# Patient Record
Sex: Male | Born: 2000 | Race: Black or African American | Hispanic: No | Marital: Single | Smoking: Never smoker
Health system: Southern US, Community
[De-identification: ages and names within clinical notes are randomized; demographics above are authoritative.]

## PROBLEM LIST (undated history)

## (undated) HISTORY — PX: HERNIA REPAIR: SHX51

---
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
## 2018-02-14 ENCOUNTER — Encounter (HOSPITAL_COMMUNITY): Payer: Self-pay

## 2018-02-14 ENCOUNTER — Emergency Department (HOSPITAL_COMMUNITY): Payer: Medicaid Other

## 2018-02-14 ENCOUNTER — Emergency Department (HOSPITAL_COMMUNITY)
Admission: EM | Admit: 2018-02-14 | Discharge: 2018-02-14 | Disposition: A | Discharge status: Home / Self Care | Payer: Medicaid Other | Attending: Emergency Medicine | Admitting: Emergency Medicine

## 2018-02-14 DIAGNOSIS — R21 Rash and other nonspecific skin eruption: Secondary | ICD-10-CM | POA: Insufficient documentation

## 2018-02-14 DIAGNOSIS — M25562 Pain in left knee: Secondary | ICD-10-CM | POA: Diagnosis present

## 2018-02-14 DIAGNOSIS — Z79899 Other long term (current) drug therapy: Secondary | ICD-10-CM | POA: Diagnosis not present

## 2018-02-14 MED ORDER — MUPIROCIN 2 % EX OINT: 1.0000 "application " | TOPICAL_OINTMENT | Freq: Three times a day (TID) | CUTANEOUS | 1 refills | Status: AC

## 2018-02-14 MED ORDER — DOXYCYCLINE HYCLATE 100 MG PO CAPS: 100.0000 mg | ORAL_CAPSULE | Freq: Two times a day (BID) | ORAL | 0 refills | Status: AC

## 2018-02-14 NOTE — ED Notes (Signed)
See provider assessment 

## 2018-02-14 NOTE — ED Provider Notes (Signed)
[facility] EMERGENCY DEPARTMENT Provider Note   CSN: [id number] Arrival date & time: 02/14/18  1804     History   Chief Complaint Chief Complaint  Patient presents with  . Rash    HPI Ronnie Sanders is a 17 y.o. male.  HPI 17 year old African-American male with no pertinent past medical history presents to the emergency department today with complaints of rash to the back of his head and left knee pain.  Patient states that the rash in the back of his hands are raised bumps that have intermittent bleeding at times.  The burn and itch.  Patient states that he shaved his head and noticed more bumps that was extending into his hairline.  Patient denies any associated fevers or chills.  Patient has not taking any over-the-counter medications for his symptoms.  He states that he has been on and off present for the past 1-2 years.  He has not seen a dermatologist for the same.  Patient denies sexually active.  Low suspicion for STD.  Patient also complains of some left knee pain that started approximately 7 months ago after twisting sharply causing a popping sensation in his left knee.  Patient states that the pain has been in on the anterior aspect of his knee.  Pain does not travel.  Associated with prolonged walking and bending.  Nothing makes better.  He is not take anything for the pain prior to arrival.  Has not followed up with orthopedic doctor.  Has any associated paresthesias or weakness.  History reviewed. No pertinent past medical history.  There are no active problems to display for this patient.   Past Surgical History:  Procedure Laterality Date  . HERNIA REPAIR         Home Medications    Prior to Admission medications   Medication Sig Start Date End Date Taking? Authorizing Provider  dextromethorphan (TUSSIN COUGH) 15 MG/5ML syrup Take 10 mLs (30 mg total) by mouth 3 (three) times daily as needed. 10/05/15   Hedges, Tinnie Gens, PA-C    diphenhydrAMINE (BENADRYL) 50 MG capsule Take 1 Cap PO Q6h x 24 hours then Q6h prn 07/09/16   Lowanda Foster, NP  famotidine (PEPCID) 20 MG tablet Take 1 tablet (20 mg total) by mouth daily. X 4 days 07/09/16   Lowanda Foster, NP  ipratropium (ATROVENT) 0.06 % nasal spray Place 2 sprays into the nose 4 (four) times daily. 02/03/14   Linna Hoff, MD  ondansetron (ZOFRAN) 4 MG tablet Take 1 tablet (4 mg total) by mouth every 6 (six) hours as needed for nausea. 07/09/16   Lowanda Foster, NP  predniSONE (DELTASONE) 50 MG tablet Starting tomorrow, Monday 07/10/2016, take 1 tab PO QD x 4 days 07/09/16   Lowanda Foster, NP    Family History History reviewed. No pertinent family history.  Social History Social History   Tobacco Use  . Smoking status: Never Smoker  . Smokeless tobacco: Never Used  Substance Use Topics  . Alcohol use: No  . Drug use: No     Allergies   Patient has no known allergies.   Review of Systems Review of Systems  Constitutional: Negative for chills and fever.  HENT: Negative for congestion.   Gastrointestinal: Negative for nausea and vomiting.  Musculoskeletal: Positive for arthralgias, joint swelling and myalgias. Negative for neck pain and neck stiffness.  Skin: Positive for wound.  Neurological: Negative for weakness and numbness.     Physical Exam Updated Vital Signs BP  130/71 (BP Location: Right Arm)   Pulse 78   Temp 98.8 F (37.1 C) (Oral)   Resp 18   Ht 6' (1.829 m)   Wt 124.7 kg (275 lb)   SpO2 98%   BMI 37.30 kg/m   Physical Exam  Constitutional: He appears well-developed and well-nourished. No distress.  HENT:  Head: Normocephalic and atraumatic.  Eyes: Right eye exhibits no discharge. Left eye exhibits no discharge. No scleral icterus.  Neck: Normal range of motion.  Pulmonary/Chest: No respiratory distress.  Musculoskeletal: Normal range of motion.       Right knee: He exhibits normal range of motion, no swelling, no effusion, no  ecchymosis, no deformity, no laceration, no erythema, normal alignment, no LCL laxity, normal patellar mobility, no bony tenderness, normal meniscus and no MCL laxity. Tenderness found. Patellar tendon tenderness noted. No medial joint line, no lateral joint line, no MCL and no LCL tenderness noted.  No joint laxity.  Negative anterior drawer test.  DP pulses 2+ bilaterally.  Sensation intact.  Brisk cap refill.  No lower extremity edema or calf tenderness.  Neurological: He is alert.  Skin: Skin is warm and dry. Capillary refill takes less than 2 seconds. No pallor.  Patient has erythematous and skin colored vesicles and papules to the back of the head that extend into the hairline.  No drainage noted.  No area of fluctuance.  No associated lymphadenopathy.  Pruritic.  Psychiatric: His behavior is normal. Judgment and thought content normal.  Nursing note and vitals reviewed.      ED Treatments / Results  Labs (all labs ordered are listed, but only abnormal results are displayed) Labs Reviewed - No data to display  EKG  EKG Interpretation None       Radiology Dg Knee 2 Views Right  Result Date: 02/14/2018 CLINICAL DATA:  Right knee pain since last summer. EXAM: RIGHT KNEE - 1-2 VIEW COMPARISON:  None. FINDINGS: No evidence of fracture, dislocation, or joint effusion. No evidence of arthropathy or other focal bone abnormality. Clothing artifact is seen along the lateral aspect of the knee simulating a vascular graft on the lateral view. IMPRESSION: 1. Clothing artifact along the lateral aspect of the knee is believed to simulate a vascular graft along the posterior aspect of the right knee. 2. No acute osseous abnormality of the right knee is noted. No fracture, joint effusion or malalignment. No significant soft tissue swelling. Electronically Signed   By: Tollie Eth M.D.   On: 02/14/2018 19:05    Procedures Procedures (including critical care time)  Medications Ordered in  ED Medications - No data to display   Initial Impression / Assessment and Plan / ED Course  I have reviewed the triage vital signs and the nursing notes.  Pertinent labs & imaging results that were available during my care of the patient were reviewed by me and considered in my medical decision making (see chart for details).     Patient to the ED with multiple complaints have been ongoing for several months to years.  First patient complains of rash to the back of his head that is ongoing for 1-2 years intermittently.  Describes pruritic in nature.  Denies any associated fever, chills.  Denies reactive or concern of STD.  On exam these appear to be folliculitis.  Will treat with antibiotics.  Have also encouraged patient to follow-up with a dermatologist if symptoms not improving.  No area of fluctuance that be concerning for drainable abscess.  In terms of his left knee pain that has been ongoing for several months after mechanical injury x-ray shows no acute fractures.  Patient is neurovascularly intact.  Ambulatory with normal gait.  Will provide knee sleeve and follow-up with orthopedics.  There is no erythema or warmth of the joint that would be concerning for septic arthritis.  Pt is hemodynamically stable, in NAD, & able to ambulate in the ED. Evaluation does not show pathology that would require ongoing emergent intervention or inpatient treatment. I explained the diagnosis to the patient. Pain has been managed & has no complaints prior to dc. Pt is comfortable with above plan and is stable for discharge at this time. All questions were answered prior to disposition. Strict return precautions for f/u to the ED were discussed. Encouraged follow up with PCP.   Final Clinical Impressions(s) / ED Diagnoses   Final diagnoses:  Rash  Acute pain of left knee    ED Discharge Orders    None       Wallace KellerLeaphart, Kenneth T, PA-C 02/14/18 2017    Raeford RazorKohut, Stephen, MD 02/14/18 2119

## 2018-02-14 NOTE — ED Triage Notes (Signed)
Pt presents with red, raised bumps to back of his neck for years that recently has become worse, pt reports area burns with bleeding reported.  Pt also reports he shaved his head x 2 days ago "because my hair wasn't growing like it should".  Pt also reports R knee pain since last summer, reports he was dancing and felt a pop in knee; reports the pain is worse now.

## 2018-02-14 NOTE — Discharge Instructions (Signed)
This is likely something called folliculitis.  We will treat you with antibiotics.  Follow-up with a dermatologist.  May also keep an over the counter cream such as hydrocortisone to help with the itching.  May also take Zyrtec or Claritin to help with itching.  Your knee x-ray was normal.  Wear the knee sleeve for comfort. Please rest, ice, compress and elevated the affected body part to help with swelling and pain.  Motrin and Tylenol for pain or swelling.  Follow-up with orthopedic doctor.

## 2018-12-07 ENCOUNTER — Other Ambulatory Visit: Payer: Self-pay

## 2018-12-07 ENCOUNTER — Encounter (HOSPITAL_COMMUNITY): Payer: Self-pay

## 2018-12-07 ENCOUNTER — Emergency Department (HOSPITAL_COMMUNITY)
Admission: EM | Admit: 2018-12-07 | Discharge: 2018-12-07 | Disposition: A | Discharge status: Home / Self Care | Payer: 59 | Attending: Emergency Medicine | Admitting: Emergency Medicine

## 2018-12-07 DIAGNOSIS — R51 Headache: Secondary | ICD-10-CM | POA: Diagnosis present

## 2018-12-07 DIAGNOSIS — Z79899 Other long term (current) drug therapy: Secondary | ICD-10-CM | POA: Diagnosis not present

## 2018-12-07 DIAGNOSIS — L299 Pruritus, unspecified: Secondary | ICD-10-CM | POA: Diagnosis not present

## 2018-12-07 DIAGNOSIS — G44209 Tension-type headache, unspecified, not intractable: Secondary | ICD-10-CM | POA: Insufficient documentation

## 2018-12-07 DIAGNOSIS — G44219 Episodic tension-type headache, not intractable: Secondary | ICD-10-CM

## 2018-12-07 LAB — CBC
HCT: 44.3 % (ref 39.0–52.0)
Hemoglobin: 14.4 g/dL (ref 13.0–17.0)
MCH: 29.3 pg (ref 26.0–34.0)
MCHC: 32.5 g/dL (ref 30.0–36.0)
MCV: 90 fL (ref 80.0–100.0)
NRBC: 0 % (ref 0.0–0.2)
Platelets: 222 10*3/uL (ref 150–400)
RBC: 4.92 MIL/uL (ref 4.22–5.81)
RDW: 11.9 % (ref 11.5–15.5)
WBC: 4.3 10*3/uL (ref 4.0–10.5)

## 2018-12-07 LAB — BASIC METABOLIC PANEL
ANION GAP: 10 (ref 5–15)
BUN: 7 mg/dL (ref 6–20)
CO2: 25 mmol/L (ref 22–32)
Calcium: 8.9 mg/dL (ref 8.9–10.3)
Chloride: 107 mmol/L (ref 98–111)
Creatinine, Ser: 0.83 mg/dL (ref 0.61–1.24)
GFR calc Af Amer: >60 mL/min (ref >60)
GFR calc non Af Amer: >60 mL/min (ref >60)
Glucose, Bld: 90 mg/dL (ref 70–99)
POTASSIUM: 3.6 mmol/L (ref 3.5–5.1)
Sodium: 142 mmol/L (ref 135–145)

## 2018-12-07 LAB — CBG MONITORING, ED: GLUCOSE-CAPILLARY: 79 mg/dL (ref 70–99)

## 2018-12-07 MED ORDER — DIPHENHYDRAMINE HCL 50 MG/ML IJ SOLN
12.5000 mg | Freq: Once | INTRAMUSCULAR | Status: AC
  Administered 2018-12-07: 12.5 mg via INTRAVENOUS
  Filled 2018-12-07: qty 1

## 2018-12-07 MED ORDER — METOCLOPRAMIDE HCL 5 MG/ML IJ SOLN
10.0000 mg | Freq: Once | INTRAMUSCULAR | Status: AC
  Administered 2018-12-07: 10 mg via INTRAVENOUS
  Filled 2018-12-07: qty 2

## 2018-12-07 MED ORDER — SODIUM CHLORIDE 0.9 % IV BOLUS
500.0000 mL | Freq: Once | INTRAVENOUS | Status: AC
  Administered 2018-12-07: 500 mL via INTRAVENOUS

## 2018-12-07 NOTE — Discharge Instructions (Addendum)
Thank you for allowing us to be part of your care.   Please take benadryl and tylenol as needed for your headaches.   Please follow-up with urgent care or return to the emergency department if your symptoms worsen, you develop severe fever, or severe nausea and vomiting.

## 2018-12-07 NOTE — ED Triage Notes (Signed)
Patient c/o headache x 3 days, bilateral foot rash, and generalized itching x 4 days. Patient also c/o sensitivity to light and blurred vision.

## 2018-12-07 NOTE — ED Provider Notes (Signed)
[facility]-EMERGENCY DEPT Provider Note   CSN: [id number] Arrival date & time: 12/07/18  1106     History   Chief Complaint Chief Complaint  Patient presents with  . Headache  . foot rash  . Pruritis    HPI Ronnie Sanders is a 17 y.o. male with no significant PMH presenting with headache, photophobia, dizziness for the past five days with some blurred vision yesterday. He describes his headache as anterior and temporal bilaterally. He endorses nausea but no vomiting and states he has had decreased appetite and fluid intake. He has also had loose bowel movements about four times per night that seems to be resolving.   In addition he is worried he has atheletes foot as he has itching on his feet that is not resolving with topical cream his father gave him.   HPI  History reviewed. No pertinent past medical history.  There are no active problems to display for this patient.   Past Surgical History:  Procedure Laterality Date  . HERNIA REPAIR          Home Medications    Prior to Admission medications   Medication Sig Start Date End Date Taking? Authorizing Provider  dextromethorphan (TUSSIN COUGH) 15 MG/5ML syrup Take 10 mLs (30 mg total) by mouth 3 (three) times daily as needed. 10/05/15   Hedges, Tinnie Gens, PA-C  diphenhydrAMINE (BENADRYL) 50 MG capsule Take 1 Cap PO Q6h x 24 hours then Q6h prn 07/09/16   Lowanda Foster, NP  doxycycline (VIBRAMYCIN) 100 MG capsule Take 1 capsule (100 mg total) by mouth 2 (two) times daily. 02/14/18   Rise Mu, PA-C  famotidine (PEPCID) 20 MG tablet Take 1 tablet (20 mg total) by mouth daily. X 4 days 07/09/16   Lowanda Foster, NP  ipratropium (ATROVENT) 0.06 % nasal spray Place 2 sprays into the nose 4 (four) times daily. 02/03/14   Linna Hoff, MD  mupirocin ointment (BACTROBAN) 2 % Apply 1 application topically 3 (three) times daily. 02/14/18   Rise Mu, PA-C  ondansetron (ZOFRAN) 4 MG tablet Take  1 tablet (4 mg total) by mouth every 6 (six) hours as needed for nausea. 07/09/16   Lowanda Foster, NP  predniSONE (DELTASONE) 50 MG tablet Starting tomorrow, Monday 07/10/2016, take 1 tab PO QD x 4 days 07/09/16   Lowanda Foster, NP    Family History Family History  Problem Relation Age of Onset  . Healthy Mother   . Healthy Father     Social History Social History   Tobacco Use  . Smoking status: Never Smoker  . Smokeless tobacco: Never Used  Substance Use Topics  . Alcohol use: No  . Drug use: No     Allergies   Patient has no known allergies.   Review of Systems Review of Systems Review of systems reviewed and are negative for acute change, except as noted in the HPI.    Physical Exam Updated Vital Signs BP (!) 153/80 (BP Location: Right Arm)   Pulse 84   Temp 99.2 F (37.3 C) (Oral)   Resp 18   Ht 5\' 11"  (1.803 m)   Wt 119.7 kg   SpO2 99%   BMI 36.82 kg/m   Physical Exam   ED Treatments / Results  Labs (all labs ordered are listed, but only abnormal results are displayed) Labs Reviewed  CBC  BASIC METABOLIC PANEL  RPR  HIV ANTIBODY (ROUTINE TESTING W REFLEX)  CBG MONITORING, ED  GC/CHLAMYDIA  PROBE AMP ([place]    EKG None  Radiology No results found.  Procedures Procedures (including critical care time)  Medications Ordered in ED Medications  metoCLOPramide (REGLAN) injection 10 mg (10 mg Intravenous Given 12/07/18 1624)  sodium chloride 0.9 % bolus 500 mL (0 mLs Intravenous Stopped 12/07/18 1739)  diphenhydrAMINE (BENADRYL) injection 12.5 mg (12.5 mg Intravenous Given 12/07/18 1625)     Initial Impression / Assessment and Plan / ED Course  I have reviewed the triage vital signs and the nursing notes.  Pertinent labs & imaging results that were available during my care of the patient were reviewed by me and considered in my medical decision making (see chart for details).  Clinical Course as of Dec 07 1746  Sat Dec 07, 2018  1551 17yo male presenting with photophobia, dizziness, constant bilateral headache for the past five days not relieved with medication. No neck stiffness. 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 Additionally has bilaterally itching of his feet. Will obtain labs and treat with migraine cocktail. CBG normal.    [JS]    Clinical Course User Index [JS] Jonetta Dagley A, DO    Labs ordered unremarkable thus far, agreeable to STD testing. Patient's headache and dizziness resolved with fluid bolus and migraine cocktail, and he wishes to leave. He is stable for discharge and given return precautions and recommend prn benadryl and tylenol for headaches. Will contact if any labs abnormal. Advised to use lotion for LE itching as he does not appear to have athlete's foot but dry skin without erythema, swelling, or skin changes. Recommended establishment with PCP as well.   Final Clinical Impressions(s) / ED Diagnoses   Final diagnoses:  Episodic tension-type headache, not intractable  Itching    ED Discharge Orders    None       Versie StarksSeawell, Videl Nobrega A, DO 12/07/18 1748    Tilden Fossaees, Elizabeth, MD 12/11/18 1122    Tilden Fossaees, Elizabeth, MD 12/18/18 1112

## 2018-12-08 LAB — RPR: RPR: NONREACTIVE

## 2018-12-09 LAB — GC/CHLAMYDIA PROBE AMP (~~LOC~~) NOT AT ARMC
Chlamydia: NEGATIVE
NEISSERIA GONORRHEA: NEGATIVE

## 2018-12-09 LAB — HIV ANTIBODY (ROUTINE TESTING W REFLEX)

## 2019-05-31 ENCOUNTER — Other Ambulatory Visit: Payer: Self-pay | Admitting: *Deleted

## 2019-05-31 DIAGNOSIS — Z20822 Contact with and (suspected) exposure to covid-19: Secondary | ICD-10-CM

## 2019-06-02 LAB — NOVEL CORONAVIRUS, NAA: SARS-CoV-2, NAA: NOT DETECTED

## 2019-06-03 ENCOUNTER — Telehealth: Payer: Self-pay

## 2019-06-03 NOTE — Telephone Encounter (Signed)
Patient called to receive Covid test results. Advised test results are negative which means you were not infected with the novel coronavirus, he verbalized understanding. I asked how he is feeling, he says he still has a cough with mucus that has been ongoing for a long time, but no other symptoms. He says he's going to call the doctor to let them know about the cough. I advised if symptom worsen and he develops, go to the ED, he verbalized understanding.

## 2019-07-28 ENCOUNTER — Emergency Department (HOSPITAL_COMMUNITY)
Admission: EM | Admit: 2019-07-28 | Discharge: 2019-07-28 | Disposition: A | Discharge status: Home / Self Care | Payer: 59 | Attending: Emergency Medicine | Admitting: Emergency Medicine

## 2019-07-28 ENCOUNTER — Other Ambulatory Visit: Payer: Self-pay

## 2019-07-28 ENCOUNTER — Encounter (HOSPITAL_COMMUNITY): Payer: Self-pay | Admitting: Emergency Medicine

## 2019-07-28 ENCOUNTER — Emergency Department (HOSPITAL_COMMUNITY): Payer: 59

## 2019-07-28 DIAGNOSIS — R51 Headache: Secondary | ICD-10-CM | POA: Diagnosis not present

## 2019-07-28 DIAGNOSIS — R6883 Chills (without fever): Secondary | ICD-10-CM | POA: Insufficient documentation

## 2019-07-28 DIAGNOSIS — R519 Headache, unspecified: Secondary | ICD-10-CM

## 2019-07-28 DIAGNOSIS — B349 Viral infection, unspecified: Secondary | ICD-10-CM | POA: Diagnosis not present

## 2019-07-28 DIAGNOSIS — R0982 Postnasal drip: Secondary | ICD-10-CM | POA: Insufficient documentation

## 2019-07-28 LAB — GROUP A STREP BY PCR: Group A Strep by PCR: NOT DETECTED

## 2019-07-28 MED ORDER — IBUPROFEN 400 MG PO TABS
600.0000 mg | ORAL_TABLET | Freq: Once | ORAL | Status: AC
  Administered 2019-07-28: 600 mg via ORAL
  Filled 2019-07-28: qty 1

## 2019-07-28 MED ORDER — SUMATRIPTAN SUCCINATE 6 MG/0.5ML ~~LOC~~ SOLN
6.0000 mg | Freq: Once | SUBCUTANEOUS | Status: AC
  Administered 2019-07-28: 6 mg via SUBCUTANEOUS
  Filled 2019-07-28: qty 0.5

## 2019-07-28 NOTE — ED Provider Notes (Signed)
[facility] EMERGENCY DEPARTMENT Provider Note   CSN: [id number] Arrival date & time: 07/28/19  1053     History   Chief Complaint Chief Complaint  Patient presents with  . Headache    HPI Ronnie Sanders is a 18 y.o. male who presents with a headache, nasal congestion, sore throat. No significant PMH. He states that since yesterday he has developed a constant right sided headache, tearing of the R eye, and R sided rhinorrhea and pain "when he breathes cold air". He also reports chills, sweats, and a sore throat. He states that he works at [employer] and he wears a mask. He was tested for COVID today. He has not had a fever but does reports an intermittent cough which has been ongoing for 6 months. He does vape. He has not tried anything for his symptoms.  HPI  History reviewed. No pertinent past medical history.  There are no active problems to display for this patient.   Past Surgical History:  Procedure Laterality Date  . HERNIA REPAIR          Home Medications    Prior to Admission medications   Medication Sig Start Date End Date Taking? Authorizing Provider  dextromethorphan (TUSSIN COUGH) 15 MG/5ML syrup Take 10 mLs (30 mg total) by mouth 3 (three) times daily as needed. 10/05/15   Hedges, Dellis Filbert, PA-C  diphenhydrAMINE (BENADRYL) 50 MG capsule Take 1 Cap PO Q6h x 24 hours then Q6h prn 07/09/16   Kristen Cardinal, NP  doxycycline (VIBRAMYCIN) 100 MG capsule Take 1 capsule (100 mg total) by mouth 2 (two) times daily. 02/14/18   Doristine Devoid, PA-C  famotidine (PEPCID) 20 MG tablet Take 1 tablet (20 mg total) by mouth daily. X 4 days 07/09/16   Kristen Cardinal, NP  ipratropium (ATROVENT) 0.06 % nasal spray Place 2 sprays into the nose 4 (four) times daily. 02/03/14   Billy Fischer, MD  mupirocin ointment (BACTROBAN) 2 % Apply 1 application topically 3 (three) times daily. 02/14/18   Doristine Devoid, PA-C  ondansetron (ZOFRAN) 4 MG tablet Take 1 tablet  (4 mg total) by mouth every 6 (six) hours as needed for nausea. 07/09/16   Kristen Cardinal, NP  predniSONE (DELTASONE) 50 MG tablet Starting tomorrow, Monday 07/10/2016, take 1 tab PO QD x 4 days 07/09/16   Kristen Cardinal, NP    Family History Family History  Problem Relation Age of Onset  . Healthy Mother   . Healthy Father     Social History Social History   Tobacco Use  . Smoking status: Never Smoker  . Smokeless tobacco: Never Used  Substance Use Topics  . Alcohol use: No  . Drug use: No     Allergies   Patient has no known allergies.   Review of Systems Review of Systems  Constitutional: Positive for chills and diaphoresis. Negative for fever.  HENT:       +tearing of the right eye  Respiratory: Positive for cough. Negative for shortness of breath and wheezing.   Cardiovascular: Negative for chest pain.     Physical Exam Updated Vital Signs BP 133/81   Pulse 94   Temp 99.4 F (37.4 C)   Resp 16   Ht 5\' 11"  (1.803 m)   Wt 117.9 kg   SpO2 98%   BMI 36.26 kg/m   Physical Exam Vitals signs and nursing note reviewed.  Constitutional:      General: He is not in acute  distress.    Appearance: He is well-developed. He is obese. He is not ill-appearing.  HENT:     Head: Normocephalic and atraumatic.     Right Ear: Tympanic membrane is scarred.     Left Ear: Tympanic membrane is scarred.     Nose: Rhinorrhea (right sided nasal discharge) present.     Mouth/Throat:     Lips: Pink.     Mouth: Mucous membranes are moist.     Pharynx: Posterior oropharyngeal erythema present.  Eyes:     General: No scleral icterus.       Right eye: No discharge.        Left eye: No discharge.     Conjunctiva/sclera: Conjunctivae normal.     Pupils: Pupils are equal, round, and reactive to light.  Neck:     Musculoskeletal: Normal range of motion.  Cardiovascular:     Rate and Rhythm: Normal rate and regular rhythm.  Pulmonary:     Effort: Pulmonary effort is normal. No  respiratory distress.     Breath sounds: Normal breath sounds.  Abdominal:     General: There is no distension.     Palpations: Abdomen is soft.     Tenderness: There is no abdominal tenderness.  Skin:    General: Skin is warm and dry.  Neurological:     Mental Status: He is alert and oriented to person, place, and time.  Psychiatric:        Behavior: Behavior normal.      ED Treatments / Results  Labs (all labs ordered are listed, but only abnormal results are displayed) Labs Reviewed  GROUP A STREP BY PCR    EKG None  Radiology Dg Chest Port 1 View  Result Date: 07/28/2019 CLINICAL DATA:  RIGHT-side headache, sore throat, nasal drainage, sweating and chills since yesterday EXAM: PORTABLE CHEST 1 VIEW COMPARISON:  Portable exam 1123 hours compared to 02/13/2016 FINDINGS: Upper normal heart size. Mediastinal contours and pulmonary vascularity normal. Lungs clear. No infiltrate, pleural effusion or pneumothorax. Bones unremarkable. IMPRESSION: No acute abnormalities. Electronically Signed   By: Ulyses SouthwardMark  Boles M.D.   On: 07/28/2019 11:45    Procedures Procedures (including critical care time)  Medications Ordered in ED Medications  ibuprofen (ADVIL) tablet 600 mg (600 mg Oral Given 07/28/19 1131)     Initial Impression / Assessment and Plan / ED Course  I have reviewed the triage vital signs and the nursing notes.  Pertinent labs & imaging results that were available during my care of the patient were reviewed by me and considered in my medical decision making (see chart for details).  18 year old male presents with R sided headache, R ear tearing, runny nose, sore throat, chills, sweats. Has a chronic cough as well and vapes. Has been tested for COVID today. He is well appearing. Exam is remarkable for eye tearing, runny nose, slight erythema of the throat. DDx: COVID, PNA, strep, mono, other viral syndrome. Also question cluster headache. Will start on O2, give Ibuprofen, and  check strep and CXR.  Strep is negative. CXR is negative. We tried O2, sumatriptan, and Ibuprofen and he doesn't feel much better. With normal vitals and well appearnce I do not think he needs any further work up here. Will d/c with supportive care.    Final Clinical Impressions(s) / ED Diagnoses   Final diagnoses:  Bad headache  Viral syndrome    ED Discharge Orders    None  Bethel BornGekas, Durward Matranga Marie, PA-C 07/28/19 1417    Melene PlanFloyd, Dan, DO 07/28/19 1440

## 2019-07-28 NOTE — ED Triage Notes (Addendum)
Pt reports R side headache, sore throat, nasal drainage, sweating and chills since yesterday. Pt denies ear pain, fever, or sick contact.

## 2019-07-28 NOTE — Discharge Instructions (Signed)
Please rest and drink plenty of fluids Take Ibuprofen or Tylenol for pain or fever Please stay at home until you get the results of your COVID test

## 2019-07-28 NOTE — ED Notes (Signed)
Pt placed on 2L [place] per Kelly-PA

## 2019-08-03 ENCOUNTER — Emergency Department (HOSPITAL_COMMUNITY)
Admission: EM | Admit: 2019-08-03 | Discharge: 2019-08-03 | Disposition: A | Discharge status: Home / Self Care | Payer: 59 | Attending: Emergency Medicine | Admitting: Emergency Medicine

## 2019-08-03 ENCOUNTER — Other Ambulatory Visit: Payer: Self-pay

## 2019-08-03 ENCOUNTER — Encounter (HOSPITAL_COMMUNITY): Payer: Self-pay | Admitting: Emergency Medicine

## 2019-08-03 DIAGNOSIS — R51 Headache: Secondary | ICD-10-CM | POA: Diagnosis present

## 2019-08-03 DIAGNOSIS — R519 Headache, unspecified: Secondary | ICD-10-CM

## 2019-08-03 LAB — BASIC METABOLIC PANEL
Anion gap: 11 (ref 5–15)
BUN: 7 mg/dL (ref 6–20)
CO2: 23 mmol/L (ref 22–32)
Calcium: 9.5 mg/dL (ref 8.9–10.3)
Chloride: 101 mmol/L (ref 98–111)
Creatinine, Ser: 0.84 mg/dL (ref 0.61–1.24)
GFR calc Af Amer: >60 mL/min (ref >60)
GFR calc non Af Amer: >60 mL/min (ref >60)
Glucose, Bld: 127 mg/dL — ABNORMAL HIGH (ref 70–99)
Potassium: 4.1 mmol/L (ref 3.5–5.1)
Sodium: 135 mmol/L (ref 135–145)

## 2019-08-03 LAB — CBC WITH DIFFERENTIAL/PLATELET
Abs Immature Granulocytes: 0.02 10*3/uL (ref 0.00–0.07)
Basophils Absolute: 0 10*3/uL (ref 0.0–0.1)
Basophils Relative: 0 %
Eosinophils Absolute: 0.4 10*3/uL (ref 0.0–0.5)
Eosinophils Relative: 5 %
HCT: 43.2 % (ref 39.0–52.0)
Hemoglobin: 14.1 g/dL (ref 13.0–17.0)
Immature Granulocytes: 0 %
Lymphocytes Relative: 34 %
Lymphs Abs: 2.3 10*3/uL (ref 0.7–4.0)
MCH: 30.1 pg (ref 26.0–34.0)
MCHC: 32.6 g/dL (ref 30.0–36.0)
MCV: 92.1 fL (ref 80.0–100.0)
Monocytes Absolute: 0.6 10*3/uL (ref 0.1–1.0)
Monocytes Relative: 8 %
Neutro Abs: 3.5 10*3/uL (ref 1.7–7.7)
Neutrophils Relative %: 53 %
Platelets: 331 10*3/uL (ref 150–400)
RBC: 4.69 MIL/uL (ref 4.22–5.81)
RDW: 12.4 % (ref 11.5–15.5)
WBC: 6.7 10*3/uL (ref 4.0–10.5)
nRBC: 0 % (ref 0.0–0.2)

## 2019-08-03 MED ORDER — DEXAMETHASONE SODIUM PHOSPHATE 10 MG/ML IJ SOLN
10.0000 mg | Freq: Once | INTRAMUSCULAR | Status: AC
  Administered 2019-08-03: 10 mg via INTRAVENOUS
  Filled 2019-08-03: qty 1

## 2019-08-03 MED ORDER — SODIUM CHLORIDE 0.9 % IV BOLUS
1000.0000 mL | Freq: Once | INTRAVENOUS | Status: AC
  Administered 2019-08-03: 09:00:00 1000 mL via INTRAVENOUS

## 2019-08-03 MED ORDER — METOCLOPRAMIDE HCL 5 MG/ML IJ SOLN
10.0000 mg | Freq: Once | INTRAMUSCULAR | Status: AC
  Administered 2019-08-03: 10 mg via INTRAVENOUS
  Filled 2019-08-03: qty 2

## 2019-08-03 MED ORDER — DIPHENHYDRAMINE HCL 50 MG/ML IJ SOLN
25.0000 mg | Freq: Once | INTRAMUSCULAR | Status: AC
  Administered 2019-08-03: 25 mg via INTRAVENOUS
  Filled 2019-08-03: qty 1

## 2019-08-03 MED ORDER — KETOROLAC TROMETHAMINE 30 MG/ML IJ SOLN
30.0000 mg | Freq: Once | INTRAMUSCULAR | Status: AC
  Administered 2019-08-03: 10:00:00 30 mg via INTRAVENOUS
  Filled 2019-08-03: qty 1

## 2019-08-03 NOTE — ED Triage Notes (Signed)
Pt c/o right sided headache, sore throat and nasal drainage x 6 days. Tested neg for COVID

## 2019-08-03 NOTE — Discharge Instructions (Signed)
Your work-up today was reassuring and I am glad your headache is gone away.  You can use Motrin or Excedrin at home as needed for recurrent headaches.  If you continue having headaches is very important a follow-up with your primary care doctor regarding this.  Get help right away if: Your migraine headache gets very bad. Your migraine headache lasts longer than 72 hours. You have a fever. You have a stiff neck. You have trouble seeing. Your muscles feel weak or like you cannot control them. You start to lose your balance a lot. You start to have trouble walking. You pass out (faint). You have a seizure.

## 2019-08-03 NOTE — ED Provider Notes (Signed)
[facility] Provider [facility]   CSN: [id number] Arrival date & time: 08/03/19  [id number]    History   Chief Complaint Chief Complaint  Patient presents with  . Headache    HPI Ronnie Sanders is a 18 y.o. male.     Ronnie Sanders is a 18 y.o. male who is otherwise healthy, presents to the emergency department for evaluation of headache.  Patient reports he was initially seen in the emergency department on 8/3, at that time he was having headache, cough, sore throat and rhinorrhea.  He reports that his cough and sore throat have completely resolved, he has had 2 negative COVID tests.  He reports that his headache has persisted over the week.  It comes and goes, does tend to improve some with ibuprofen.  He reports it as a throbbing ache on the right side of his head.  He denies any associated vision changes, just some light sensitivity.  He denies any neck pain or stiffness.  No fevers or chills.  No numbness weakness or tingling and no dizziness.  He has not had any abdominal pain, nausea or vomiting.  No other aggravating or alleviating factors.     History reviewed. No pertinent past medical history.  There are no active problems to display for this patient.   Past Surgical History:  Procedure Laterality Date  . HERNIA REPAIR          Home Medications    Prior to Admission medications   Medication Sig Start Date End Date Taking? Authorizing Provider  dextromethorphan (TUSSIN COUGH) 15 MG/5ML syrup Take 10 mLs (30 mg total) by mouth 3 (three) times daily as needed. 10/05/15   Hedges, Tinnie GensJeffrey, PA-C  diphenhydrAMINE (BENADRYL) 50 MG capsule Take 1 Cap PO Q6h x 24 hours then Q6h prn 07/09/16   Lowanda FosterBrewer, Mindy, NP  doxycycline (VIBRAMYCIN) 100 MG capsule Take 1 capsule (100 mg total) by mouth 2 (two) times daily. 02/14/18   Rise MuLeaphart, Kenneth T, PA-C  famotidine (PEPCID) 20 MG tablet Take 1 tablet (20 mg total) by mouth daily. X 4 days 07/09/16    Lowanda FosterBrewer, Mindy, NP  ipratropium (ATROVENT) 0.06 % nasal spray Place 2 sprays into the nose 4 (four) times daily. 02/03/14   Linna HoffKindl, James D, MD  mupirocin ointment (BACTROBAN) 2 % Apply 1 application topically 3 (three) times daily. 02/14/18   Rise MuLeaphart, Kenneth T, PA-C  ondansetron (ZOFRAN) 4 MG tablet Take 1 tablet (4 mg total) by mouth every 6 (six) hours as needed for nausea. 07/09/16   Lowanda FosterBrewer, Mindy, NP  predniSONE (DELTASONE) 50 MG tablet Starting tomorrow, Monday 07/10/2016, take 1 tab PO QD x 4 days 07/09/16   Lowanda FosterBrewer, Mindy, NP    Family History Family History  Problem Relation Age of Onset  . Healthy Mother   . Healthy Father     Social History Social History   Tobacco Use  . Smoking status: Never Smoker  . Smokeless tobacco: Never Used  Substance Use Topics  . Alcohol use: No  . Drug use: No     Allergies   Patient has no known allergies.   Review of Systems Review of Systems  Constitutional: Negative for chills and fever.  HENT: Positive for congestion. Negative for sore throat.   Eyes: Positive for photophobia. Negative for visual disturbance.  Respiratory: Negative for cough and shortness of breath.   Cardiovascular: Negative for chest pain.  Gastrointestinal: Negative for abdominal pain, nausea and vomiting.  Genitourinary:  Negative for dysuria.  Musculoskeletal: Negative for myalgias, neck pain and neck stiffness.  Skin: Negative for color change and rash.  Neurological: Positive for headaches. Negative for dizziness, tremors, seizures, syncope, facial asymmetry, speech difficulty, weakness, light-headedness and numbness.     Physical Exam Updated Vital Signs BP 125/61 (BP Location: Right Arm)   Pulse 87   Temp 99.4 F (37.4 C)   Resp 20   Ht 5\' 11"  (1.803 m)   Wt 117.9 kg   SpO2 100%   BMI 36.26 kg/m   Physical Exam Vitals signs and nursing note reviewed.  Constitutional:      General: He is not in acute distress.    Appearance: He is  well-developed and normal weight. He is not diaphoretic.  HENT:     Head: Normocephalic and atraumatic.     Mouth/Throat:     Mouth: Mucous membranes are moist.     Pharynx: Oropharynx is clear.  Eyes:     General:        Right eye: No discharge.        Left eye: No discharge.     Extraocular Movements: Extraocular movements intact.     Pupils: Pupils are equal, round, and reactive to light.  Neck:     Musculoskeletal: Neck supple. No neck rigidity.     Meningeal: Brudzinski's sign and Kernig's sign absent.  Cardiovascular:     Rate and Rhythm: Normal rate and regular rhythm.     Heart sounds: Normal heart sounds.  Pulmonary:     Effort: Pulmonary effort is normal. No respiratory distress.     Breath sounds: Normal breath sounds. No wheezing or rales.     Comments: Respirations equal and unlabored, patient able to speak in full sentences, lungs clear to auscultation bilaterally Abdominal:     General: Bowel sounds are normal. There is no distension.     Palpations: Abdomen is soft. There is no mass.     Tenderness: There is no abdominal tenderness. There is no guarding.     Comments: Abdomen soft, nondistended, nontender to palpation in all quadrants without guarding or peritoneal signs  Musculoskeletal:        General: No deformity.  Skin:    General: Skin is warm and dry.     Capillary Refill: Capillary refill takes less than 2 seconds.  Neurological:     Mental Status: He is alert and oriented to person, place, and time.     GCS: GCS eye subscore is 4. GCS verbal subscore is 5. GCS motor subscore is 6.     Coordination: Coordination normal.     Comments: Speech is clear, able to follow commands CN III-XII intact Normal strength in upper and lower extremities bilaterally including dorsiflexion and plantar flexion, strong and equal grip strength Sensation normal to light and sharp touch Moves extremities without ataxia, coordination intact  Psychiatric:        Mood and  Affect: Mood normal.        Behavior: Behavior normal.      ED Treatments / Results  Labs (all labs ordered are listed, but only abnormal results are displayed) Labs Reviewed  BASIC METABOLIC PANEL - Abnormal; Notable for the following components:      Result Value   Glucose, Bld 127 (*)    All other components within normal limits  CBC WITH DIFFERENTIAL/PLATELET    EKG None  Radiology No results found.  Procedures Procedures (including critical care time)  Medications Ordered in  ED Medications  sodium chloride 0.9 % bolus 1,000 mL (1,000 mLs Intravenous New Bag/Given 08/03/19 0928)  metoCLOPramide (REGLAN) injection 10 mg (10 mg Intravenous Given 08/03/19 0931)  ketorolac (TORADOL) 30 MG/ML injection 30 mg (30 mg Intravenous Given 08/03/19 0939)  dexamethasone (DECADRON) injection 10 mg (10 mg Intravenous Given 08/03/19 0935)  diphenhydrAMINE (BENADRYL) injection 25 mg (25 mg Intravenous Given 08/03/19 0931)     Initial Impression / Assessment and Plan / ED Course  I have reviewed the triage vital signs and the nursing notes.  Pertinent labs & imaging results that were available during my care of the patient were reviewed by me and considered in my medical decision making (see chart for details).  Patient presents with headache which has been intermittent over the past 6 days.  Initially had some sore throat and cough but those have completely resolved he does still have some slight nasal drainage but has had a negative Covid test, no fevers, no nuchal rigidity to suggest meningitis and no focal neurologic deficits.  No red flag symptoms.  Patient treated with headache cocktail with complete resolution of headache.  Labs show no significant electrolyte abnormalities and no leukocytosis.  At this time feel patient is stable for discharge home, discussed PCP follow-up and over-the-counter medications for headache treatment if needed at home.  Return precautions discussed.  Patient  expresses understanding and agreement with plan.  Discharged home in good condition.  Final Clinical Impressions(s) / ED Diagnoses   Final diagnoses:  Bad headache    ED Discharge Orders    None       Janet Berlin 08/03/19 1047    Virgel Manifold, MD 08/06/19 [phone]

## 2020-03-17 IMAGING — DX PORTABLE CHEST - 1 VIEW
1 series · 1 of 1 positions shown · non-contrast
Comparison: Portable exam 3345 hours compared to 02/13/2016

CLINICAL DATA: RIGHT-side headache, sore throat, nasal drainage,
sweating and chills since yesterday

EXAM:
PORTABLE CHEST 1 VIEW

[chest]
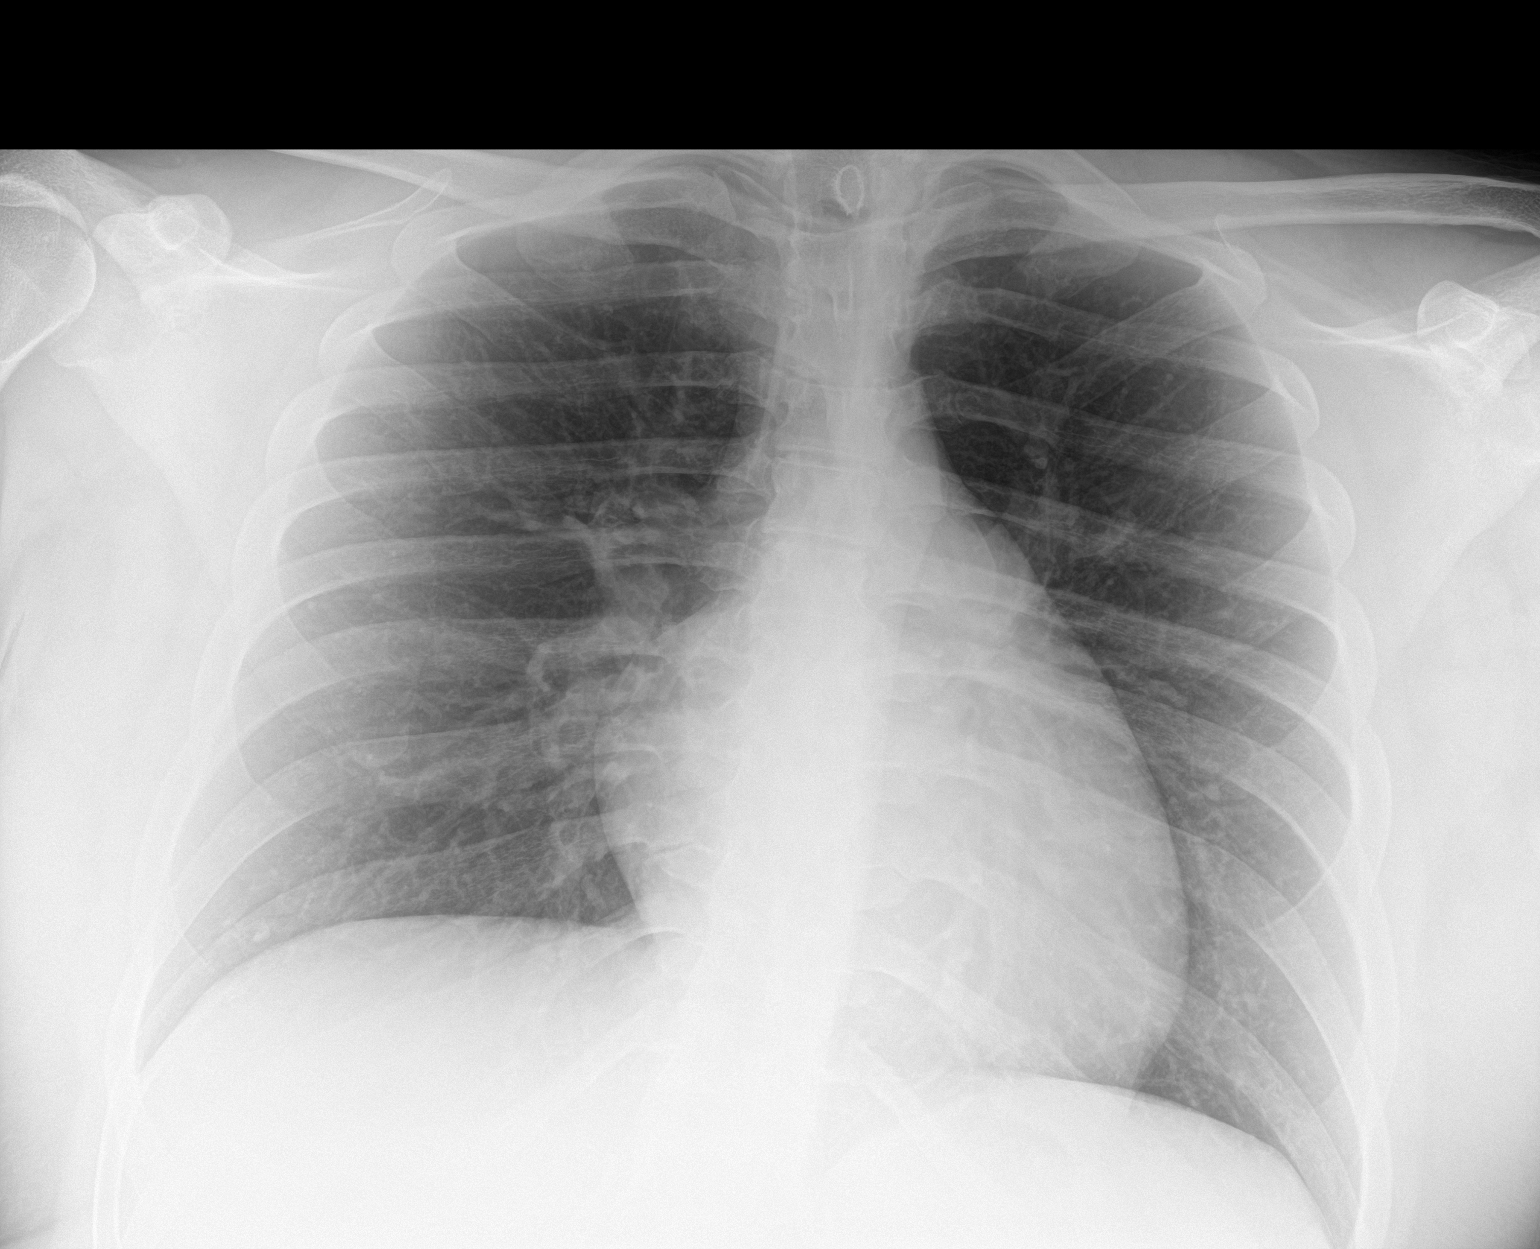

[1 of 1 positions shown; findings below may reference images not displayed]

FINDINGS: Upper normal heart size.

Mediastinal contours and pulmonary vascularity normal.

Lungs clear.

No infiltrate, pleural effusion or pneumothorax.

Bones unremarkable.
IMPRESSION: No acute abnormalities.

## 2022-08-02 ENCOUNTER — Emergency Department (HOSPITAL_COMMUNITY)
Admission: EM | Admit: 2022-08-02 | Discharge: 2022-08-03 | Discharge status: Against Medical Advice | Payer: No Typology Code available for payment source | Attending: Emergency Medicine | Admitting: Emergency Medicine

## 2022-08-02 ENCOUNTER — Encounter (HOSPITAL_COMMUNITY): Payer: Self-pay

## 2022-08-02 ENCOUNTER — Other Ambulatory Visit: Payer: Self-pay

## 2022-08-02 DIAGNOSIS — L720 Epidermal cyst: Secondary | ICD-10-CM | POA: Insufficient documentation

## 2022-08-02 DIAGNOSIS — Z5321 Procedure and treatment not carried out due to patient leaving prior to being seen by health care provider: Secondary | ICD-10-CM | POA: Insufficient documentation

## 2022-08-02 DIAGNOSIS — R509 Fever, unspecified: Secondary | ICD-10-CM | POA: Insufficient documentation

## 2022-08-02 LAB — CBC WITH DIFFERENTIAL/PLATELET
Abs Immature Granulocytes: 0.02 10*3/uL (ref 0.00–0.07)
Basophils Absolute: 0 10*3/uL (ref 0.0–0.1)
Basophils Relative: 0 %
Eosinophils Absolute: 0.4 10*3/uL (ref 0.0–0.5)
Eosinophils Relative: 7 %
HCT: 43.2 % (ref 39.0–52.0)
Hemoglobin: 14.1 g/dL (ref 13.0–17.0)
Immature Granulocytes: 0 %
Lymphocytes Relative: 35 %
Lymphs Abs: 1.7 10*3/uL (ref 0.7–4.0)
MCH: 28.7 pg (ref 26.0–34.0)
MCHC: 32.6 g/dL (ref 30.0–36.0)
MCV: 87.8 fL (ref 80.0–100.0)
Monocytes Absolute: 0.5 10*3/uL (ref 0.1–1.0)
Monocytes Relative: 9 %
Neutro Abs: 2.3 10*3/uL (ref 1.7–7.7)
Neutrophils Relative %: 49 %
Platelets: 249 10*3/uL (ref 150–400)
RBC: 4.92 MIL/uL (ref 4.22–5.81)
RDW: 12.3 % (ref 11.5–15.5)
WBC: 4.8 10*3/uL (ref 4.0–10.5)
nRBC: 0 % (ref 0.0–0.2)

## 2022-08-02 LAB — COMPREHENSIVE METABOLIC PANEL
ALT: 27 U/L (ref 0–44)
AST: 24 U/L (ref 15–41)
Albumin: 4.1 g/dL (ref 3.5–5.0)
Alkaline Phosphatase: 80 U/L (ref 38–126)
Anion gap: 9 (ref 5–15)
BUN: 11 mg/dL (ref 6–20)
CO2: 23 mmol/L (ref 22–32)
Calcium: 9.7 mg/dL (ref 8.9–10.3)
Chloride: 101 mmol/L (ref 98–111)
Creatinine, Ser: 0.97 mg/dL (ref 0.61–1.24)
GFR, Estimated: >60 mL/min (ref >60)
Glucose, Bld: 121 mg/dL — ABNORMAL HIGH (ref 70–99)
Potassium: 3.5 mmol/L (ref 3.5–5.1)
Sodium: 133 mmol/L — ABNORMAL LOW (ref 135–145)
Total Bilirubin: 1 mg/dL (ref 0.3–1.2)
Total Protein: 10.3 g/dL — ABNORMAL HIGH (ref 6.5–8.1)

## 2022-08-02 LAB — URINALYSIS, ROUTINE W REFLEX MICROSCOPIC
Bilirubin Urine: NEGATIVE
Glucose, UA: NEGATIVE mg/dL
Hgb urine dipstick: NEGATIVE
Ketones, ur: 20 mg/dL — AB
Leukocytes,Ua: NEGATIVE
Nitrite: NEGATIVE
Protein, ur: 30 mg/dL — AB
Specific Gravity, Urine: 1.031 — ABNORMAL HIGH (ref 1.005–1.030)
pH: 5 (ref 5.0–8.0)

## 2022-08-02 LAB — LACTIC ACID, PLASMA: Lactic Acid, Venous: 1 mmol/L (ref 0.5–1.9)

## 2022-08-02 MED ORDER — ACETAMINOPHEN 325 MG PO TABS
650.0000 mg | ORAL_TABLET | Freq: Four times a day (QID) | ORAL | Status: DC | PRN
  Administered 2022-08-02: 650 mg via ORAL
  Filled 2022-08-02: qty 2

## 2022-08-02 NOTE — ED Triage Notes (Signed)
Pt has had cyst to posterior head for 5 months. It began draining about 2 months ago but has stopped and gotten bigger. Now it is painful without touching. Pt endorses fever and pain

## 2022-08-02 NOTE — ED Notes (Signed)
Pt not trying to be seen. Checked in to visit another pt. Pt currently escorted by security up to 65M. Pt is OTF at this time.
